# Patient Record
Sex: Male | Born: 2000
Health system: Southern US, Community
[De-identification: ages and names within clinical notes are randomized; demographics above are authoritative.]

## PROBLEM LIST (undated history)

## (undated) DIAGNOSIS — F419 Anxiety disorder, unspecified: Secondary | ICD-10-CM

## (undated) DIAGNOSIS — F410 Panic disorder [episodic paroxysmal anxiety] without agoraphobia: Secondary | ICD-10-CM

---
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
## 2019-01-19 ENCOUNTER — Other Ambulatory Visit: Payer: Self-pay

## 2019-01-19 ENCOUNTER — Ambulatory Visit (INDEPENDENT_AMBULATORY_CARE_PROVIDER_SITE_OTHER): Payer: 59 | Admitting: Family Medicine

## 2019-01-19 DIAGNOSIS — F419 Anxiety disorder, unspecified: Secondary | ICD-10-CM | POA: Diagnosis not present

## 2019-01-19 DIAGNOSIS — F329 Major depressive disorder, single episode, unspecified: Secondary | ICD-10-CM

## 2019-01-19 DIAGNOSIS — F410 Panic disorder [episodic paroxysmal anxiety] without agoraphobia: Secondary | ICD-10-CM | POA: Diagnosis not present

## 2019-01-19 DIAGNOSIS — F32A Depression, unspecified: Secondary | ICD-10-CM

## 2019-01-19 MED ORDER — CLONAZEPAM 0.5 MG PO TABS: ORAL_TABLET | ORAL | 0 refills | Status: DC

## 2019-01-19 NOTE — Progress Notes (Signed)
Subjective:    Patient ID: Darrell Gonzales, male    DOB: 08-03-2000, 18 y.o.   MRN: [id number]  HPImother Trula Ore states he was shaking really bad last night and could not control his breathing and states he felt like he was having a heart attack. Lasted about 30 -40 minutes.  He had chills and then would get hot. No fever when checked. Does feel like he is stressed. Mom started to go to ER but then he calmed down. He states he did eat a lot and his stomach was hurting. Gave a tums and it helped.   Gad 7 and phq9 done. When doing phq9 pt states he does sometimes have thoughts of hurting himself. GAD 7 : Generalized Anxiety Score 01/19/2019  Nervous, Anxious, on Edge 3  Control/stop worrying 3  Worry too much - different things 3  Trouble relaxing 1  Restless 0  Easily annoyed or irritable 1  Afraid - awful might happen 1  Total GAD 7 Score 12  Anxiety Difficulty Somewhat difficult    Depression screen PHQ 2/9 01/19/2019  Decreased Interest 3  Down, Depressed, Hopeless 3  PHQ - 2 Score 6  Altered sleeping 0  Tired, decreased energy 1  Change in appetite 0  Feeling bad or failure about yourself  0  Trouble concentrating 0  Moving slowly or fidgety/restless 1  Suicidal thoughts 1  PHQ-9 Score 9  Difficult doing work/chores Somewhat difficult   This patient states he has been stressed a lot he is not specific about what he stressed about but he states at times he just feels stressed out he denies currently thinking about hurting himself but states at times he has he also states he is gone to counselors in the past but it really does not seem to help patient in addition to this finds himself anxious nervous occasional panic attacks and also at times feels depressed  His mom was on the phone also while we were doing this conversation she confirmed the above  Virtual Visit via Telephone Note  I connected with Darrell Gonzales on 01/19/19 at  1:10 PM EST by telephone and verified that I  am speaking with the correct person using two identifiers.  Location: Patient: home Provider: office   I discussed the limitations, risks, security and privacy concerns of performing an evaluation and management service by telephone and the availability of in person appointments. I also discussed with the patient that there may be a patient responsible charge related to this service. The patient expressed understanding and agreed to proceed.     History of Present Illness:    Observations/Objective:   Assessment and Plan:   Follow Up Instructions:    I discussed the assessment and treatment plan with the patient. The patient was provided an opportunity to ask questions and all were answered. The patient agreed with the plan and demonstrated an understanding of the instructions.   The patient was advised to call back or seek an in-person evaluation if the symptoms worsen or if the condition fails to improve as anticipated.  I provided 17 minutes of non-face-to-face time during this encounter.         Review of Systems  Constitutional: Negative for activity change.  HENT: Negative for congestion and rhinorrhea.   Respiratory: Negative for cough and shortness of breath.   Cardiovascular: Negative for chest pain.  Gastrointestinal: Negative for abdominal pain, diarrhea, nausea and vomiting.  Genitourinary: Negative for dysuria and hematuria.  Neurological: Negative for weakness and headaches.  Psychiatric/Behavioral: Positive for dysphoric mood. Negative for behavioral problems and confusion. The patient is nervous/anxious.        Objective:   Physical Exam   Today's visit was via telephone Physical exam was not possible for this visit      Assessment & Plan:  I am concerned that this patient has a combination of depression with anxiety along with panic attacks Offered counseling and referral patient states at this moment he does not feel like he needs to do  counseling Also discussed SSRI medication with him and he defers on this currently Patient does opt for nerve medication to use on rare occasions for panic attacks small number were sent in I discussed it with both he and his mother and recommended that she help monitor his medication Also recommend a follow-up visit in 2 to 3 weeks he would prefer to do that by phone Also told young man should he start feeling like he is going to hurt himself or have any worsening depression to reach out for help either with [place] the ER or behavioral health right away  His phone #[phone]

## 2019-01-27 ENCOUNTER — Other Ambulatory Visit: Payer: Self-pay

## 2019-01-27 ENCOUNTER — Emergency Department (HOSPITAL_COMMUNITY)
Admission: EM | Admit: 2019-01-27 | Discharge: 2019-01-28 | Disposition: A | Discharge status: Home / Self Care | Payer: 59 | Attending: Emergency Medicine | Admitting: Emergency Medicine

## 2019-01-27 ENCOUNTER — Encounter (HOSPITAL_COMMUNITY): Payer: Self-pay

## 2019-01-27 DIAGNOSIS — Z79899 Other long term (current) drug therapy: Secondary | ICD-10-CM | POA: Diagnosis not present

## 2019-01-27 DIAGNOSIS — F410 Panic disorder [episodic paroxysmal anxiety] without agoraphobia: Secondary | ICD-10-CM | POA: Diagnosis present

## 2019-01-27 HISTORY — DX: Anxiety disorder, unspecified: F41.9

## 2019-01-27 HISTORY — DX: Panic disorder (episodic paroxysmal anxiety): F41.0

## 2019-01-27 LAB — COMPREHENSIVE METABOLIC PANEL
ALT: 23 U/L (ref 0–44)
AST: 23 U/L (ref 15–41)
Albumin: 5 g/dL (ref 3.5–5.0)
Alkaline Phosphatase: 63 U/L (ref 38–126)
Anion gap: 11 (ref 5–15)
BUN: 14 mg/dL (ref 6–20)
CO2: 22 mmol/L (ref 22–32)
Calcium: 9.8 mg/dL (ref 8.9–10.3)
Chloride: 103 mmol/L (ref 98–111)
Creatinine, Ser: 1.3 mg/dL — ABNORMAL HIGH (ref 0.61–1.24)
GFR calc Af Amer: >60 mL/min (ref >60)
GFR calc non Af Amer: >60 mL/min (ref >60)
Glucose, Bld: 107 mg/dL — ABNORMAL HIGH (ref 70–99)
Potassium: 3.3 mmol/L — ABNORMAL LOW (ref 3.5–5.1)
Sodium: 136 mmol/L (ref 135–145)
Total Bilirubin: 1 mg/dL (ref 0.3–1.2)
Total Protein: 7.7 g/dL (ref 6.5–8.1)

## 2019-01-27 LAB — ACETAMINOPHEN LEVEL: Acetaminophen (Tylenol), Serum: <10 ug/mL — ABNORMAL LOW (ref 10–30)

## 2019-01-27 LAB — CBC
HCT: 45.9 % (ref 39.0–52.0)
Hemoglobin: 15.9 g/dL (ref 13.0–17.0)
MCH: 30.7 pg (ref 26.0–34.0)
MCHC: 34.6 g/dL (ref 30.0–36.0)
MCV: 88.6 fL (ref 80.0–100.0)
Platelets: 247 10*3/uL (ref 150–400)
RBC: 5.18 MIL/uL (ref 4.22–5.81)
RDW: 12 % (ref 11.5–15.5)
WBC: 5.7 10*3/uL (ref 4.0–10.5)
nRBC: 0 % (ref 0.0–0.2)

## 2019-01-27 LAB — RAPID URINE DRUG SCREEN, HOSP PERFORMED
Amphetamines: NOT DETECTED
Barbiturates: NOT DETECTED
Benzodiazepines: NOT DETECTED
Cocaine: NOT DETECTED
Opiates: NOT DETECTED
Tetrahydrocannabinol: NOT DETECTED

## 2019-01-27 LAB — SALICYLATE LEVEL: Salicylate Lvl: <7 mg/dL — ABNORMAL LOW (ref 7.0–30.0)

## 2019-01-27 LAB — ETHANOL: Alcohol, Ethyl (B): <10 mg/dL (ref <10)

## 2019-01-27 NOTE — Discharge Instructions (Addendum)
Continue medications as previously prescribed.  Return to the emergency department if you develop any new and/or concerning symptoms. 

## 2019-01-27 NOTE — ED Provider Notes (Signed)
[facility] EMERGENCY DEPARTMENT Provider Note   CSN: [id number] Arrival date & time: 01/27/19  2048     History Chief Complaint  Patient presents with  . Panic Attack  . Anxiety    Darrell Gonzales is a 19 y.o. male.  Patient is a 19 year old male with history of anxiety/panic attacks.  Darrell Gonzales presents today for evaluation of anxiety and believing that Darrell Gonzales had a panic attack.  Patient states Darrell Gonzales has had a "a lot going on" lately.  This evening Darrell Gonzales became very anxious and began to hyperventilate.  This is happened 3 times this week which is more than normal.  Patient has clonazepam at home that Darrell Gonzales takes as needed.  Darrell Gonzales took 1 this evening prior to coming here and is now feeling better.  Darrell Gonzales denies any drug or alcohol use.  Darrell Gonzales denies any suicidal or homicidal ideation.  The history is provided by the patient.  Anxiety This is a recurrent problem. The current episode started 1 to 2 hours ago. The problem occurs constantly. The problem has been resolved. Pertinent negatives include no chest pain and no shortness of breath. Nothing aggravates the symptoms. Nothing relieves the symptoms.       Past Medical History:  Diagnosis Date  . Anxiety   . Panic attack     Patient Active Problem List   Diagnosis Date Noted  . Migraine headache without aura 09/27/2013    No past surgical history on file.     No family history on file.  Social History   Tobacco Use  . Smoking status: Never Smoker  . Smokeless tobacco: Never Used  Substance Use Topics  . Alcohol use: Not on file  . Drug use: Not on file    Home Medications Prior to Admission medications   Medication Sig Start Date End Date Taking? Authorizing Provider  clonazePAM (KLONOPIN) 0.5 MG tablet Take one bid prn panic attacks 01/19/19   Babs Sciara, MD  diclofenac (VOLTAREN) 75 MG EC tablet Take 1 tablet (75 mg total) by mouth 2 (two) times daily. Patient not taking: Reported on 01/19/2019 01/24/17   Babs Sciara, MD     Allergies    Patient has no known allergies.  Review of Systems   Review of Systems  Respiratory: Negative for shortness of breath.   Cardiovascular: Negative for chest pain.  All other systems reviewed and are negative.   Physical Exam Updated Vital Signs BP 137/74 (BP Location: Right Arm)   Pulse 94   Temp 99 F (37.2 C) (Oral)   Resp 20   Ht 5\' 11"  (1.803 m)   SpO2 100%   Physical Exam Vitals and nursing note reviewed.  Constitutional:      General: Darrell Gonzales is not in acute distress.    Appearance: Darrell Gonzales is well-developed. Darrell Gonzales is not diaphoretic.  HENT:     Head: Normocephalic and atraumatic.  Cardiovascular:     Rate and Rhythm: Normal rate and regular rhythm.     Heart sounds: No murmur. No friction rub.  Pulmonary:     Effort: Pulmonary effort is normal. No respiratory distress.     Breath sounds: Normal breath sounds. No wheezing or rales.  Abdominal:     General: Bowel sounds are normal. There is no distension.     Palpations: Abdomen is soft.     Tenderness: There is no abdominal tenderness.  Musculoskeletal:        General: Normal range of motion.  Cervical back: Normal range of motion and neck supple.  Skin:    General: Skin is warm and dry.  Neurological:     Mental Status: Darrell Gonzales is alert and oriented to person, place, and time.     Coordination: Coordination normal.     ED Results / Procedures / Treatments   Labs (all labs ordered are listed, but only abnormal results are displayed) Labs Reviewed  COMPREHENSIVE METABOLIC PANEL - Abnormal; Notable for the following components:      Result Value   Potassium 3.3 (*)    Glucose, Bld 107 (*)    Creatinine, Ser 1.30 (*)    All other components within normal limits  SALICYLATE LEVEL - Abnormal; Notable for the following components:   Salicylate Lvl <8.7 (*)    All other components within normal limits  ACETAMINOPHEN LEVEL - Abnormal; Notable for the following components:   Acetaminophen (Tylenol), Serum  <10 (*)    All other components within normal limits  ETHANOL  CBC  RAPID URINE DRUG SCREEN, HOSP PERFORMED    EKG None  Radiology No results found.  Procedures Procedures (including critical care time)  Medications Ordered in ED Medications - No data to display  ED Course  I have reviewed the triage vital signs and the nursing notes.  Pertinent labs & imaging results that were available during my care of the patient were reviewed by me and considered in my medical decision making (see chart for details).    MDM Rules/Calculators/A&P  Patient presenting here with complaints of panic attack.  It seems to have resolved shortly after arriving.  Patient did take Klonopin prior to coming here.  His vitals are stable and laboratory studies are all unremarkable.  At this point, I see no indication for further evaluation or admission.  Darrell Gonzales denies any suicidal or homicidal ideation and is not having any auditory or visual hallucinations.  Final Clinical Impression(s) / ED Diagnoses Final diagnoses:  None    Rx / DC Orders ED Discharge Orders    None       Veryl Speak, MD 01/27/19 2342

## 2019-01-27 NOTE — ED Triage Notes (Signed)
Mother brings pt in for panic attacks. Said that this last time he was shaking his arms 3 in one week. This has never happened before this week.  Took antianxiety med 45 min-1 hour ago. Pt twitchy in triage.

## 2019-01-27 NOTE — ED Notes (Signed)
Lab at bedside

## 2019-02-06 IMAGING — DX DG KNEE COMPLETE 4+V*R*
4 series · 4 of 4 positions shown · non-contrast
Comparison: None.

CLINICAL DATA: Right knee effusion with generalized pain for
weeks. Pain began while playing basketball. No specific injury.

EXAM:
RIGHT KNEE - COMPLETE 4+ VIEW

[knee ap (1 of 3)]
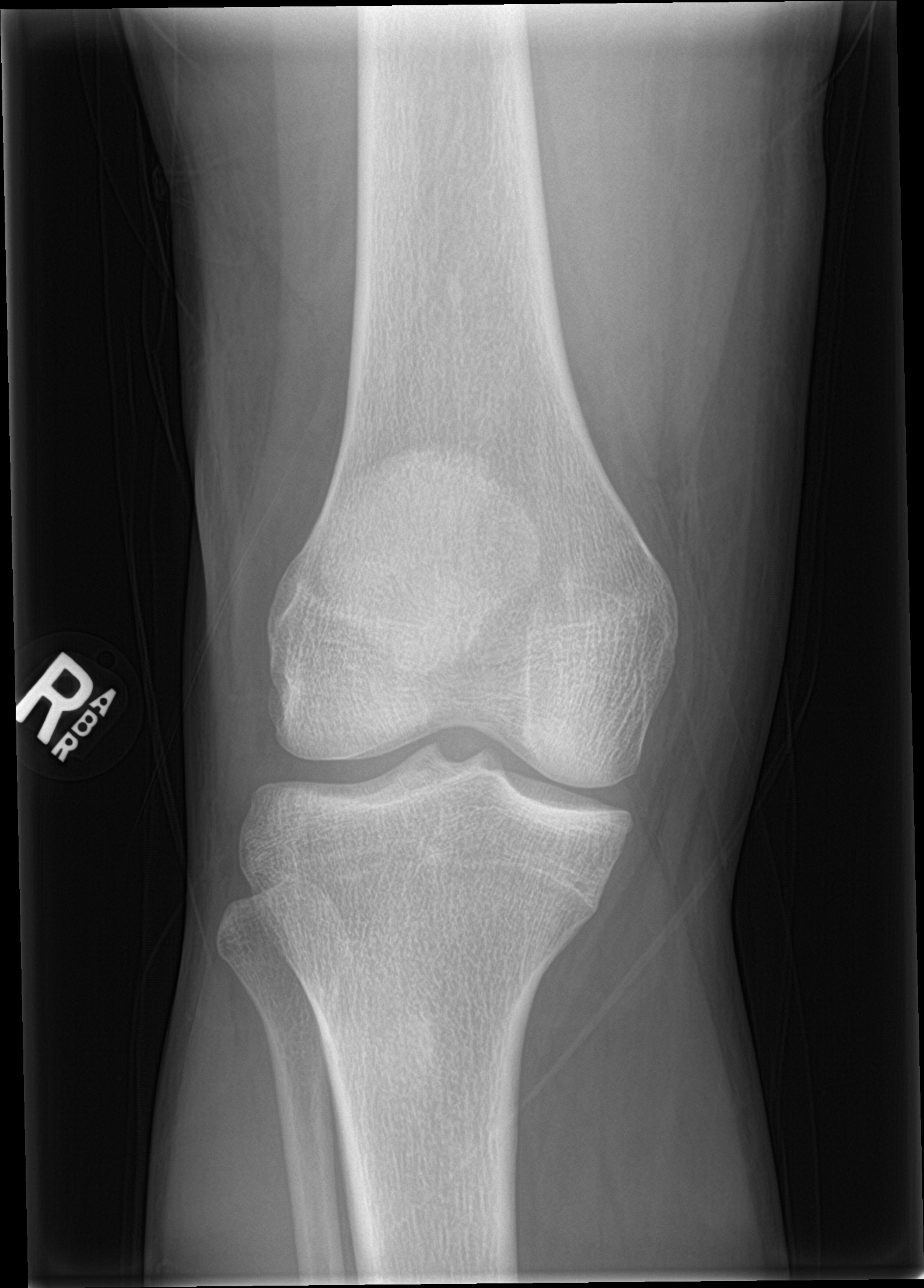

[knee ap (2 of 3)]
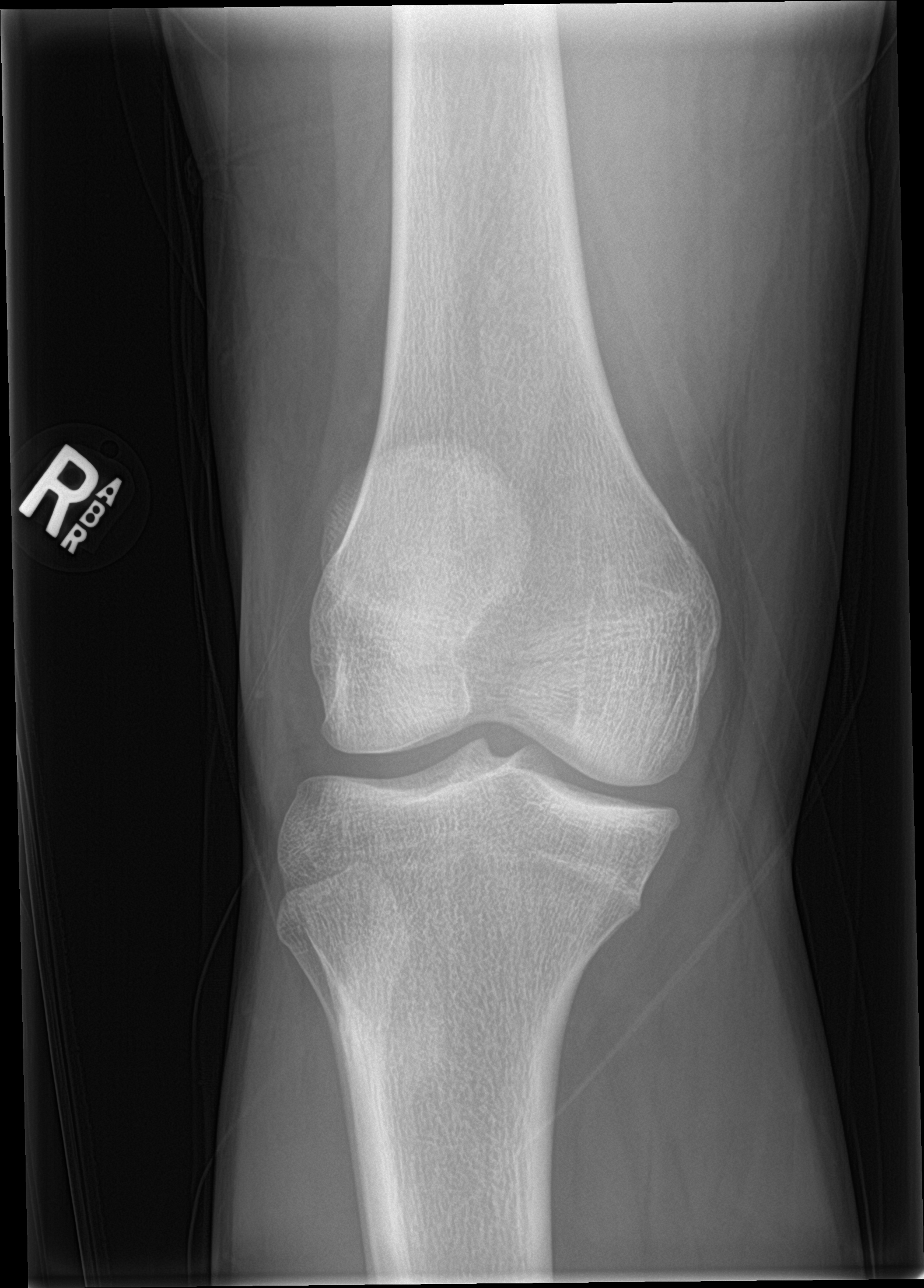

[knee ap (3 of 3)]
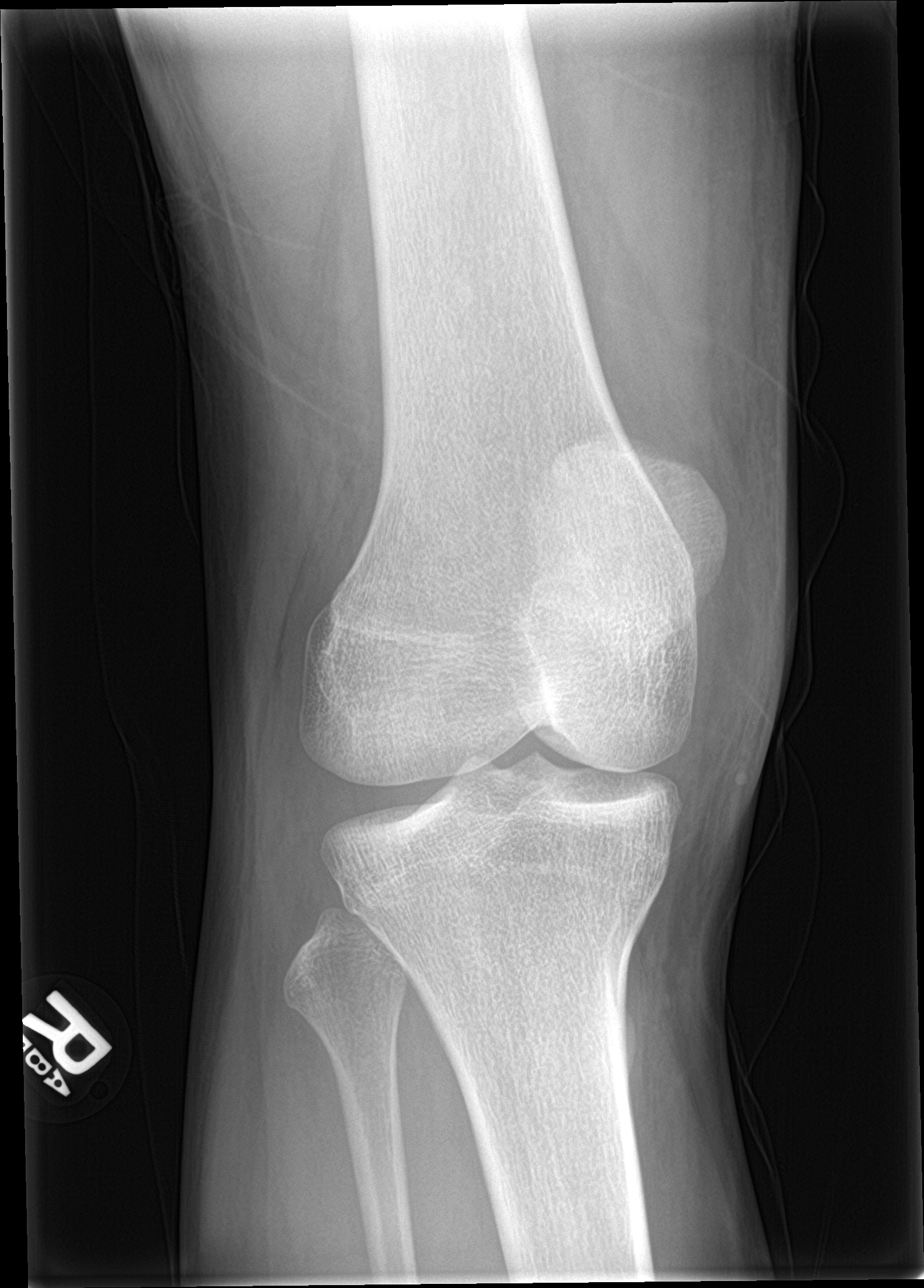

[knee lat]
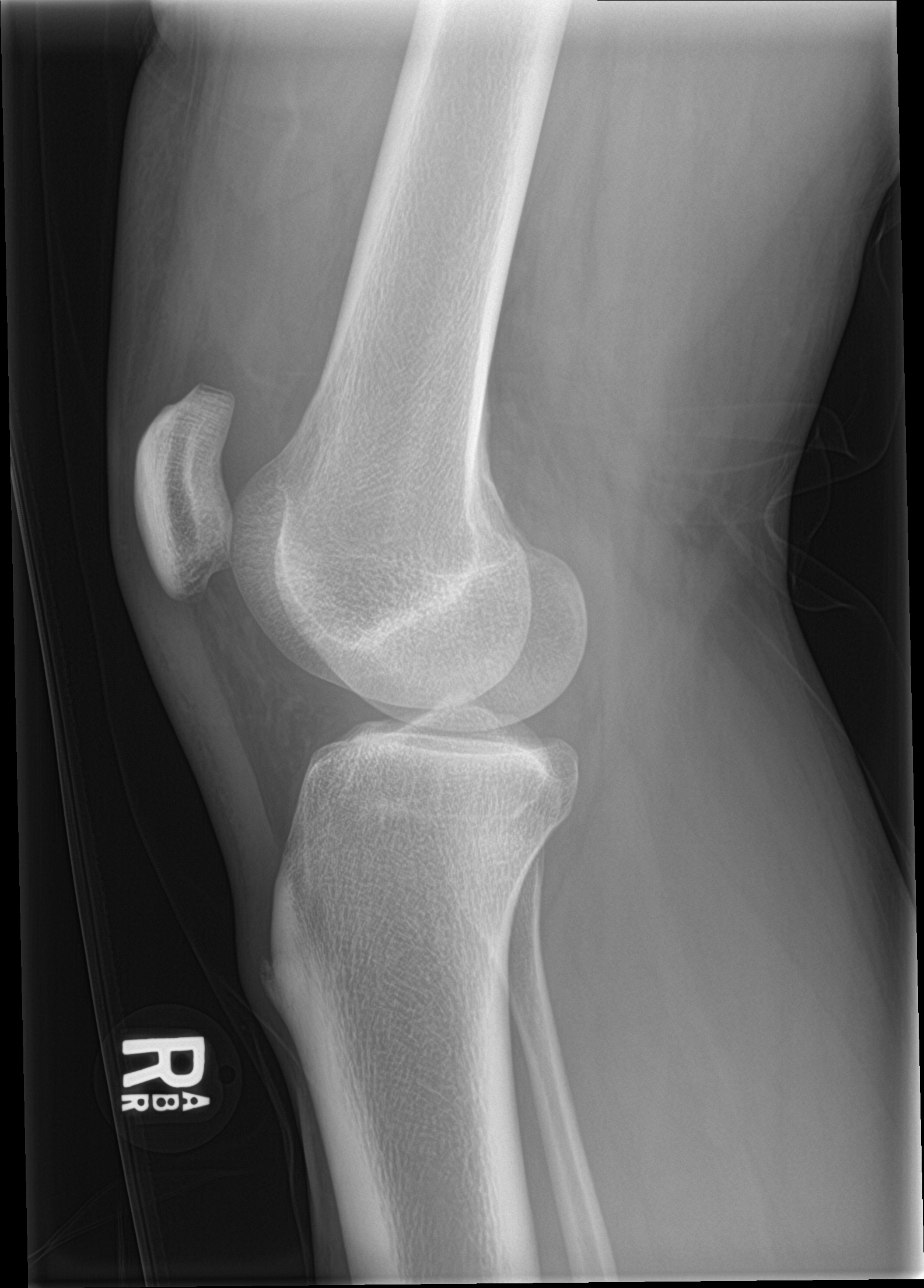

[4 of 4 positions shown; findings below may reference images not displayed]

FINDINGS: Moderate size right knee effusion. No evidence of acute fracture or
dislocation. No focal bone lesion or bone destruction. No radiopaque
soft tissue foreign bodies.
IMPRESSION: Moderate right knee effusion.  No acute bony abnormalities.

## 2019-12-15 ENCOUNTER — Other Ambulatory Visit: Payer: Self-pay

## 2019-12-15 ENCOUNTER — Ambulatory Visit (INDEPENDENT_AMBULATORY_CARE_PROVIDER_SITE_OTHER): Payer: 59 | Admitting: Family Medicine

## 2019-12-15 ENCOUNTER — Encounter: Payer: Self-pay | Admitting: Family Medicine

## 2019-12-15 VITALS — BP 126/78 | HR 60 | Temp 97.6°F | Ht 69.0 in | Wt 204.6 lb

## 2019-12-15 DIAGNOSIS — R0789 Other chest pain: Secondary | ICD-10-CM

## 2019-12-15 DIAGNOSIS — R5383 Other fatigue: Secondary | ICD-10-CM

## 2019-12-15 DIAGNOSIS — Z13220 Encounter for screening for lipoid disorders: Secondary | ICD-10-CM | POA: Diagnosis not present

## 2019-12-15 DIAGNOSIS — F410 Panic disorder [episodic paroxysmal anxiety] without agoraphobia: Secondary | ICD-10-CM

## 2019-12-15 DIAGNOSIS — Z1329 Encounter for screening for other suspected endocrine disorder: Secondary | ICD-10-CM

## 2019-12-15 DIAGNOSIS — F32A Depression, unspecified: Secondary | ICD-10-CM

## 2019-12-15 DIAGNOSIS — Z0000 Encounter for general adult medical examination without abnormal findings: Secondary | ICD-10-CM | POA: Diagnosis not present

## 2019-12-15 DIAGNOSIS — F419 Anxiety disorder, unspecified: Secondary | ICD-10-CM

## 2019-12-15 NOTE — Progress Notes (Signed)
Subjective:    Patient ID: Darrell Gonzales, male    DOB: 08/03/2000, 19 y.o.   MRN: [id number]  HPI  The patient comes in today for a wellness visit.  Patient suffering with the anxiety and depression Has gone to multiple different counselors over the past several years and he states none of them helped so he stopped going Unfortunately suffers with depression anxiety occasional panic attacks he is fearful that there is some problems underlying with his heart he is also fearful that there could be something else wrong that has not been detected in addition to this he does do a little bit of exercise but does not do much physical activity and only works part-time  A review of their health history was completed.  A review of medications was also completed.   Eating habits: not great- eats late at night and no veggies  Falls/  MVA accidents in past few months: none  Regular exercise: yes  Specialist pt sees on regular basis: none  Preventative health issues were discussed.   Additional concerns: Patient and mom have concerns of anxiety and panic attacks. Patient reports feeling fatigued and chest tightness at times and also mentioned his "heart doesn't beat right".  Review of Systems  Constitutional: Negative for diaphoresis and fatigue.  HENT: Negative for congestion and rhinorrhea.   Respiratory: Negative for cough and shortness of breath.   Cardiovascular: Negative for chest pain and leg swelling.  Gastrointestinal: Negative for abdominal pain and diarrhea.  Skin: Negative for color change and rash.  Neurological: Negative for dizziness and headaches.  Psychiatric/Behavioral: Negative for agitation, behavioral problems and confusion. The patient is nervous/anxious.        Objective:   Physical Exam Vitals reviewed.  Constitutional:      General: He is not in acute distress. HENT:     Head: Normocephalic and atraumatic.  Eyes:     General:        Right eye: No  discharge.        Left eye: No discharge.  Neck:     Trachea: No tracheal deviation.  Cardiovascular:     Rate and Rhythm: Normal rate and regular rhythm.     Heart sounds: Normal heart sounds. No murmur heard.   Pulmonary:     Effort: Pulmonary effort is normal. No respiratory distress.     Breath sounds: Normal breath sounds.  Lymphadenopathy:     Cervical: No cervical adenopathy.  Skin:    General: Skin is warm and dry.  Neurological:     Mental Status: He is alert.     Coordination: Coordination normal.  Psychiatric:        Behavior: Behavior normal.           Assessment & Plan:  Well adult exam - Plan: Basic metabolic panel  Other fatigue - Plan: CBC with Differential/Platelet, Basic metabolic panel, Hepatic function panel, TSH  Other chest pain - Plan: PR ELECTROCARDIOGRAM, COMPLETE  Screening, lipid - Plan: Lipid panel  Screening for thyroid disorder - Plan: TSH  Panic attack - Plan: Ambulatory referral to Psychiatry  Anxiety and depression - Plan: Ambulatory referral to Psychiatry med 1. Well adult exam Adult wellness-complete.wellness physical was conducted today. Importance of diet and exercise were discussed in detail.  In addition to this a discussion regarding safety was also covered. We also reviewed over immunizations and gave recommendations regarding current immunization needed for age.  In addition to this additional areas were also touched  on including: Preventative health exams needed:  Colonoscopy not indicated  Patient was advised yearly wellness exam  - Basic metabolic panel  2. Other fatigue We will check lab work.  Await the results. - CBC with Differential/Platelet - Basic metabolic panel - Hepatic function panel - TSH  3. Other chest pain I really doubt that this is heart disease.  I believe it is related to his severe depression and anxiety EKG looks good lungs sound good heart regular.  I would not recommend further testing  currently - PR ELECTROCARDIOGRAM, COMPLETE  4. Screening, lipid Mild hyperlipidemia.  Healthy diet regular activity recommended - Lipid panel  5. Screening for thyroid disorder Check thyroid because of severe fatigue - TSH  6. Panic attack Referral to psychiatry for anxiety and depression - Ambulatory referral to Psychiatry  7. Anxiety and depression Significant depression I did counsel the patient if he starts feeling suicidal to immediately go to any ER or behavioral health or urgent care center for behavioral health in [place] the phone number was given to him his mother was present when we discussed this the patient states that he is not suicidal currently. - Ambulatory referral to Psychiatry  Recheck in 1 month

## 2019-12-24 ENCOUNTER — Encounter: Payer: Self-pay | Admitting: Family Medicine

## 2019-12-24 ENCOUNTER — Telehealth: Payer: Self-pay | Admitting: Family Medicine

## 2020-01-13 ENCOUNTER — Ambulatory Visit: Payer: 59 | Admitting: Family Medicine

## 2020-01-21 ENCOUNTER — Other Ambulatory Visit: Payer: 59

## 2020-11-06 ENCOUNTER — Other Ambulatory Visit: Payer: Self-pay

## 2020-11-06 ENCOUNTER — Ambulatory Visit
Admission: EM | Admit: 2020-11-06 | Discharge: 2020-11-06 | Disposition: A | Discharge status: Home / Self Care | Payer: 59 | Attending: Emergency Medicine | Admitting: Emergency Medicine

## 2020-11-06 DIAGNOSIS — B9789 Other viral agents as the cause of diseases classified elsewhere: Secondary | ICD-10-CM | POA: Diagnosis not present

## 2020-11-06 DIAGNOSIS — J988 Other specified respiratory disorders: Secondary | ICD-10-CM

## 2020-11-06 DIAGNOSIS — Z20822 Contact with and (suspected) exposure to covid-19: Secondary | ICD-10-CM

## 2020-11-06 MED ORDER — FLUTICASONE PROPIONATE 50 MCG/ACT NA SUSP: 2.0000 | Freq: Every day | NASAL | 0 refills | Status: AC

## 2020-11-06 MED ORDER — IBUPROFEN 600 MG PO TABS: 600.0000 mg | ORAL_TABLET | Freq: Four times a day (QID) | ORAL | 0 refills | Status: AC | PRN

## 2020-11-06 NOTE — ED Triage Notes (Signed)
Patient reports having a headache, fatigue, cough and sore throat that started yesterday.

## 2020-11-06 NOTE — ED Notes (Signed)
Attempt to call patient.

## 2020-11-06 NOTE — Discharge Instructions (Addendum)
Start Mucinex-D to keep the mucous thin and to decongest you.   You may take 600 mg of motrin with 1000 mg of tylenol up to 3-4 times a day as needed for pain. This is an effective combination for pain.   Use a NeilMed sinus rinse with distilled water as often as you want to to reduce nasal congestion. Follow the directions on the box.  Flonase will also help with the nasal congestion.  COVID will be back in a day or 2.  You will get the results through MyChart.  We will contact you if it is positive.   Go to www.goodrx.com to look up your medications. This will give you a list of where you can find your prescriptions at the most affordable prices. Or you can ask the pharmacist what the cash price is. This is frequently cheaper than going through insurance.

## 2020-11-06 NOTE — ED Provider Notes (Signed)
HPI  SUBJECTIVE:  Darrell Gonzales is a 20 y.o. male who presents with headache, fatigue, nasal congestion, rhinorrhea, mild sore throat, mild occasional cough starting this morning.  No fevers, body aches, postnasal drip, loss of sense of smell or taste, wheezing or shortness of breath, nausea, vomiting, diarrhea, abdominal pain.  He is reporting sneezing, but no itchy, watery eyes.  No antibiotics in the past month.  No antipyretic in the past 6 hours.  No known COVID or flu exposure.  He did not get this years flu vaccine.  He got the second dose of the COVID-vaccine.  He has tried Mucinex without improvement in his symptoms.  No aggravating factors.  He has a past medical history of allergies.  CZY:SAYTKZ, Jonna Coup, MD  Past Medical History:  Diagnosis Date   Anxiety    Panic attack     History reviewed. No pertinent surgical history.  History reviewed. No pertinent family history.  Social History   Tobacco Use   Smoking status: Never   Smokeless tobacco: Never  Vaping Use   Vaping Use: Never used  Substance Use Topics   Alcohol use: Not Currently   Drug use: Yes    Types: Marijuana    No current facility-administered medications for this encounter.  Current Outpatient Medications:    fluticasone (FLONASE) 50 MCG/ACT nasal spray, Place 2 sprays into both nostrils daily., Disp: 16 g, Rfl: 0   ibuprofen (ADVIL) 600 MG tablet, Take 1 tablet (600 mg total) by mouth every 6 (six) hours as needed., Disp: 30 tablet, Rfl: 0  No Known Allergies   ROS  As noted in HPI.   Physical Exam  BP 119/73 (BP Location: Right Arm)   Pulse 70   Temp 98.1 F (36.7 C) (Oral)   Resp 18   SpO2 98%   Constitutional: Well developed, well nourished, no acute distress Eyes:  EOMI, conjunctiva normal bilaterally HENT: Normocephalic, atraumatic,mucus membranes moist.  Frontal sinus tenderness.  No maxillary sinus tenderness.  Erythematous, swollen turbinates with extensive clear nasal  congestion.  Slightly erythematous oropharynx, tonsils normal size without exudates.  Uvula midline. Neck: No cervical lymphadenopathy Respiratory: Normal inspiratory effort, lungs clear bilaterally Cardiovascular: Normal rate, regular rhythm no murmurs rubs or gallop GI: nondistended skin: No rash, skin intact Musculoskeletal: no deformities Neurologic: Alert & oriented x 3, no focal neuro deficits Psychiatric: Speech and behavior appropriate   ED Course   Medications - No data to display  Orders Placed This Encounter  Procedures   Novel Coronavirus, NAA ([facility])    Standing Status:   Standing    Number of Occurrences:   1    No results found for this or any previous visit (from the past 24 hour(s)). No results found.  ED Clinical Impression  1. Viral respiratory infection   2. Encounter for laboratory testing for COVID-19 virus      ED Assessment/Plan  Patient with a URI.  Sending off COVID.  Unfortunately, he will not be a candidate for antivirals because of the lack of comorbidities.  We will send home with supportive treatment including Tylenol/ibuprofen, Flonase, saline nasal irrigation, Mucinex D.  Work note.  Discussed labs, MDM, treatment plan, and plan for follow-up with patient. patient agrees with plan.   Meds ordered this encounter  Medications   fluticasone (FLONASE) 50 MCG/ACT nasal spray    Sig: Place 2 sprays into both nostrils daily.    Dispense:  16 g    Refill:  0  ibuprofen (ADVIL) 600 MG tablet    Sig: Take 1 tablet (600 mg total) by mouth every 6 (six) hours as needed.    Dispense:  30 tablet    Refill:  0      *This clinic note was created using Scientist, clinical (histocompatibility and immunogenetics). Therefore, there may be occasional mistakes despite careful proofreading.  ?    Domenick Gong, MD 11/07/20 [phone]

## 2020-11-07 LAB — SARS-COV-2, NAA 2 DAY TAT

## 2020-11-07 LAB — NOVEL CORONAVIRUS, NAA: SARS-CoV-2, NAA: NOT DETECTED

## 2020-12-05 ENCOUNTER — Ambulatory Visit
Admission: EM | Admit: 2020-12-05 | Discharge: 2020-12-05 | Disposition: A | Discharge status: Home / Self Care | Payer: 59 | Attending: Family Medicine | Admitting: Family Medicine

## 2020-12-05 ENCOUNTER — Other Ambulatory Visit: Payer: Self-pay

## 2020-12-05 DIAGNOSIS — K644 Residual hemorrhoidal skin tags: Secondary | ICD-10-CM | POA: Diagnosis not present

## 2020-12-05 MED ORDER — HYDROCORTISONE (PERIANAL) 2.5 % EX CREA: 1.0000 "application " | TOPICAL_CREAM | Freq: Two times a day (BID) | CUTANEOUS | 1 refills | Status: DC | PRN

## 2020-12-05 NOTE — ED Triage Notes (Signed)
Pt presents with what he thinks may be hemorrhoids, does a lot of heavy lifting at work

## 2020-12-05 NOTE — ED Provider Notes (Signed)
[facility]    CSN: [id number] Arrival date & time: 12/05/20  1558      History   Chief Complaint Chief Complaint  Patient presents with   Hemorrhoids    HPI Darrell Gonzales is a 20 y.o. male.   Patient presenting today with several day history of anal inflammation, irritation and itching that he believes to be from heavy lifting at work.  He states he thinks he has hemorrhoids.  Has been taking over-the-counter hemorrhoid cream and states this has been helping quite a bit.  He denies bloody stools, abdominal pain, fever, nausea, vomiting, constipation.   Past Medical History:  Diagnosis Date   Anxiety    Panic attack     Patient Active Problem List   Diagnosis Date Noted   Migraine headache without aura 09/27/2013    History reviewed. No pertinent surgical history.     Home Medications    Prior to Admission medications   Medication Sig Start Date End Date Taking? Authorizing Provider  hydrocortisone (ANUSOL-HC) 2.5 % rectal cream Place 1 application rectally 2 (two) times daily as needed for hemorrhoids or anal itching. 12/05/20  Yes Particia Nearing, PA-C  fluticasone [facility]) 50 MCG/ACT nasal spray Place 2 sprays into both nostrils daily. 11/06/20   Domenick Gong, MD  ibuprofen (ADVIL) 600 MG tablet Take 1 tablet (600 mg total) by mouth every 6 (six) hours as needed. 11/06/20   Domenick Gong, MD    Family History History reviewed. No pertinent family history.  Social History Social History   Tobacco Use   Smoking status: Never   Smokeless tobacco: Never  Vaping Use   Vaping Use: Never used  Substance Use Topics   Alcohol use: Not Currently   Drug use: Yes    Types: Marijuana     Allergies   Patient has no known allergies.   Review of Systems Review of Systems Per HPI  Physical Exam Triage Vital Signs ED Triage Vitals  Enc Vitals Group     BP 12/05/20 1852 127/70     Pulse Rate 12/05/20 1852 (!) 48     Resp  12/05/20 1852 20     Temp 12/05/20 1852 98.3 F (36.8 C)     Temp src --      SpO2 12/05/20 1852 99 %     Weight --      Height --      Head Circumference --      Peak Flow --      Pain Score 12/05/20 1851 3     Pain Loc --      Pain Edu? --      Excl. in [place]? --    No data found.  Updated Vital Signs BP 127/70   Pulse (!) 48   Temp 98.3 F (36.8 C)   Resp 20   SpO2 99%   Visual Acuity Right Eye Distance:   Left Eye Distance:   Bilateral Distance:    Right Eye Near:   Left Eye Near:    Bilateral Near:     Physical Exam Vitals and nursing note reviewed.  Constitutional:      Appearance: Normal appearance.  HENT:     Head: Atraumatic.  Eyes:     Extraocular Movements: Extraocular movements intact.     Conjunctiva/sclera: Conjunctivae normal.  Cardiovascular:     Rate and Rhythm: Normal rate and regular rhythm.  Pulmonary:     Effort: Pulmonary effort is normal.  Breath sounds: Normal breath sounds.  Abdominal:     General: Bowel sounds are normal. There is no distension.     Palpations: Abdomen is soft.     Tenderness: There is no abdominal tenderness. There is no guarding.  Genitourinary:    Comments: Declines GU exam Musculoskeletal:        General: Normal range of motion.     Cervical back: Normal range of motion and neck supple.  Skin:    General: Skin is warm and dry.  Neurological:     General: No focal deficit present.     Mental Status: He is oriented to person, place, and time.  Psychiatric:        Mood and Affect: Mood normal.        Thought Content: Thought content normal.        Judgment: Judgment normal.    UC Treatments / Results  Labs (all labs ordered are listed, but only abnormal results are displayed) Labs Reviewed - No data to display  EKG   Radiology No results found.  Procedures Procedures (including critical care time)  Medications Ordered in UC Medications - No data to display  Initial Impression / Assessment  and Plan / UC Course  I have reviewed the triage vital signs and the nursing notes.  Pertinent labs & imaging results that were available during my care of the patient were reviewed by me and considered in my medical decision making (see chart for details).     Symptoms consistent with external hemorrhoids, will treat with Anusol cream, avoid heavy lifting, sitz bath, Tucks wipes, fiber supplements, fluid intake.  Return for worsening symptoms.  Final Clinical Impressions(s) / UC Diagnoses   Final diagnoses:  External hemorrhoids   Discharge Instructions   None    ED Prescriptions     Medication Sig Dispense Auth. Provider   hydrocortisone (ANUSOL-HC) 2.5 % rectal cream Place 1 application rectally 2 (two) times daily as needed for hemorrhoids or anal itching. 80 g Particia Nearing, [place]      PDMP not reviewed this encounter.   Particia Nearing, [place] 12/05/20 1932

## 2021-01-17 ENCOUNTER — Other Ambulatory Visit: Payer: Self-pay

## 2021-01-17 ENCOUNTER — Ambulatory Visit (INDEPENDENT_AMBULATORY_CARE_PROVIDER_SITE_OTHER): Payer: 59 | Admitting: Family Medicine

## 2021-01-17 VITALS — BP 116/69 | HR 48 | Temp 98.2°F | Ht 69.5 in | Wt 188.0 lb

## 2021-01-17 DIAGNOSIS — Z0001 Encounter for general adult medical examination with abnormal findings: Secondary | ICD-10-CM

## 2021-01-17 DIAGNOSIS — R5383 Other fatigue: Secondary | ICD-10-CM | POA: Diagnosis not present

## 2021-01-17 DIAGNOSIS — Z0000 Encounter for general adult medical examination without abnormal findings: Secondary | ICD-10-CM
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
## 2021-01-23 ENCOUNTER — Other Ambulatory Visit: Payer: Self-pay

## 2021-01-23 ENCOUNTER — Ambulatory Visit
Admission: EM | Admit: 2021-01-23 | Discharge: 2021-01-23 | Disposition: A | Discharge status: Home / Self Care | Payer: 59 | Attending: Urgent Care | Admitting: Urgent Care

## 2021-01-23 DIAGNOSIS — Z1152 Encounter for screening for COVID-19: Secondary | ICD-10-CM | POA: Diagnosis not present

## 2021-01-23 NOTE — ED Triage Notes (Signed)
Pt needs covid test for school. No sx.

## 2021-01-24 ENCOUNTER — Encounter: Payer: Self-pay | Admitting: Family Medicine

## 2021-01-24 LAB — SICKLE CELL SCREEN: Sickle Cell Screen: NEGATIVE

## 2021-01-25 LAB — NOVEL CORONAVIRUS, NAA: SARS-CoV-2, NAA: NOT DETECTED

## 2021-01-25 LAB — SARS-COV-2, NAA 2 DAY TAT

## 2021-12-11 ENCOUNTER — Ambulatory Visit (INDEPENDENT_AMBULATORY_CARE_PROVIDER_SITE_OTHER): Payer: 59 | Admitting: Family Medicine

## 2021-12-11 VITALS — BP 114/70 | HR 77 | Temp 98.9°F | Ht 69.5 in | Wt 192.6 lb

## 2021-12-11 DIAGNOSIS — J019 Acute sinusitis, unspecified: Secondary | ICD-10-CM | POA: Diagnosis not present

## 2021-12-11 MED ORDER — PROMETHAZINE-DM 6.25-15 MG/5ML PO SYRP: 5.0000 mL | ORAL_SOLUTION | Freq: Four times a day (QID) | ORAL | 0 refills | Status: DC | PRN

## 2021-12-11 MED ORDER — AMOXICILLIN-POT CLAVULANATE 875-125 MG PO TABS: 1.0000 | ORAL_TABLET | Freq: Two times a day (BID) | ORAL | 0 refills | Status: DC

## 2021-12-11 NOTE — Progress Notes (Signed)
Subjective:  Patient ID: Darrell Gonzales, male    DOB: 03-21-2000  Age: 21 y.o. MRN: [id number]  CC: Chief Complaint  Patient presents with   Cough    1 x week   Headache   Nasal Congestion    HPI:  21 year old male for evaluation of the above.  Patient reports that he has been sick for the past week.  Reports sinus pressure and congestion, associated headache, and cough.  Reports that he has some initial sore throat but this is improved.  No documented fever.  He reports feeling hot and cold.  No reported sick contacts.  No relieving factors.  Patient Active Problem List   Diagnosis Date Noted   Acute rhinosinusitis 12/11/2021   Migraine headache without aura 09/27/2013    Social Hx   Social History   Socioeconomic History   Marital status: Single    Spouse name: Not on file   Number of children: Not on file   Years of education: Not on file   Highest education level: Not on file  Occupational History   Not on file  Tobacco Use   Smoking status: Never   Smokeless tobacco: Never  Vaping Use   Vaping Use: Never used  Substance and Sexual Activity   Alcohol use: Not Currently   Drug use: Yes    Types: Marijuana   Sexual activity: Not on file  Other Topics Concern   Not on file  Social History Narrative   Not on file   Social Determinants of Health   Financial Resource Strain: Not on file  Food Insecurity: Not on file  Transportation Needs: Not on file  Physical Activity: Not on file  Stress: Not on file  Social Connections: Not on file    Review of Systems Per HPI  Objective:  BP 114/70   Pulse 77   Temp 98.9 F (37.2 C) (Oral)   Ht 5' 9.5" (1.765 m)   Wt 192 lb 9.6 oz (87.4 kg)   SpO2 97%   BMI 28.03 kg/m      12/11/2021    4:02 PM 01/23/2021    8:44 AM 01/17/2021   10:21 AM  BP/Weight  Systolic BP 114 107 116  Diastolic BP 70 70 69  Wt. (Lbs) 192.6  188  BMI 28.03 kg/m2  27.36 kg/m2    Physical Exam Vitals and nursing note reviewed.   Constitutional:      General: He is not in acute distress.    Appearance: Normal appearance.  HENT:     Head: Normocephalic and atraumatic.     Right Ear: Tympanic membrane normal.     Left Ear: Tympanic membrane normal.     Nose: Congestion present.     Mouth/Throat:     Pharynx: Posterior oropharyngeal erythema present. No oropharyngeal exudate.  Eyes:     General:        Right eye: No discharge.        Left eye: No discharge.     Conjunctiva/sclera: Conjunctivae normal.  Cardiovascular:     Rate and Rhythm: Normal rate and regular rhythm.  Pulmonary:     Effort: Pulmonary effort is normal.     Breath sounds: Normal breath sounds. No wheezing or rales.  Neurological:     Mental Status: He is alert.    Lab Results  Component Value Date   WBC 5.7 01/27/2019   HGB 15.9 01/27/2019   HCT 45.9 01/27/2019   PLT 247 01/27/2019  GLUCOSE 107 (H) 01/27/2019   ALT 23 01/27/2019   AST 23 01/27/2019   NA 136 01/27/2019   K 3.3 (L) 01/27/2019   CL 103 01/27/2019   CREATININE 1.30 (H) 01/27/2019   BUN 14 01/27/2019   CO2 22 01/27/2019     Assessment & Plan:   Problem List Items Addressed This Visit       Respiratory   Acute rhinosinusitis - Primary    Treating empirically with Augmentin.  Promethazine DM for cough.  COVID, flu, RSV testing.      Relevant Medications   amoxicillin-clavulanate (AUGMENTIN) 875-125 MG tablet   promethazine-dextromethorphan (PROMETHAZINE-DM) 6.25-15 MG/5ML syrup   Other Relevant Orders   COVID-19, Flu A+B and RSV    Meds ordered this encounter  Medications   amoxicillin-clavulanate (AUGMENTIN) 875-125 MG tablet    Sig: Take 1 tablet by mouth 2 (two) times daily.    Dispense:  20 tablet    Refill:  0   promethazine-dextromethorphan (PROMETHAZINE-DM) 6.25-15 MG/5ML syrup    Sig: Take 5 mLs by mouth 4 (four) times daily as needed for cough.    Dispense:  118 mL    Refill:  0    Follow-up:  Return if symptoms worsen or fail to  improve.  Everlene Other DO [facility]

## 2021-12-11 NOTE — Assessment & Plan Note (Signed)
Treating empirically with Augmentin.  Promethazine DM for cough.  COVID, flu, RSV testing.

## 2021-12-11 NOTE — Patient Instructions (Signed)
Medication as prescribed while awaiting test results.  Take care  Dr. Adriana Simas

## 2021-12-13 LAB — COVID-19, FLU A+B AND RSV
Influenza A, NAA: NOT DETECTED
Influenza B, NAA: NOT DETECTED
RSV, NAA: DETECTED — AB
SARS-CoV-2, NAA: NOT DETECTED

## 2022-03-27 ENCOUNTER — Encounter: Payer: Self-pay | Admitting: Emergency Medicine

## 2022-03-27 ENCOUNTER — Ambulatory Visit
Admission: EM | Admit: 2022-03-27 | Discharge: 2022-03-27 | Disposition: A | Discharge status: Home / Self Care | Payer: 59 | Attending: Family Medicine | Admitting: Family Medicine

## 2022-03-27 ENCOUNTER — Other Ambulatory Visit: Payer: Self-pay

## 2022-03-27 DIAGNOSIS — J069 Acute upper respiratory infection, unspecified: Secondary | ICD-10-CM | POA: Diagnosis not present

## 2022-03-27 DIAGNOSIS — R519 Headache, unspecified: Secondary | ICD-10-CM

## 2022-03-27 DIAGNOSIS — R059 Cough, unspecified: Secondary | ICD-10-CM | POA: Insufficient documentation

## 2022-03-27 DIAGNOSIS — Z1152 Encounter for screening for COVID-19: Secondary | ICD-10-CM | POA: Insufficient documentation

## 2022-03-27 MED ORDER — KETOROLAC TROMETHAMINE 30 MG/ML IJ SOLN
30.0000 mg | Freq: Once | INTRAMUSCULAR | Status: AC
  Administered 2022-03-27: 30 mg via INTRAMUSCULAR

## 2022-03-27 NOTE — ED Triage Notes (Signed)
Pt reports cough, sneezing, headache since yesterday. Pt reports cough and sneezing have improved with otc medication but reports headache and "eye strain" remains.

## 2022-03-27 NOTE — ED Provider Notes (Signed)
[facility]    CSN: [id number] Arrival date & time: 03/27/22  0900      History   Chief Complaint Chief Complaint  Patient presents with   Cough    HPI Darrell Gonzales is a 22 y.o. male.   Patient presenting today with 3-day history of cough, sneezing, nasal congestion, headache, eye pressure.  Denies fever, chills, chest pain, shortness of breath, abdominal pain, nausea vomiting or diarrhea.  Taking DayQuil, NyQuil, Sudafed with good relief of the congestion and sneezing but states the headache and eye pressure remains.  No known sick contacts or pertinent chronic medical problems per patient.    Past Medical History:  Diagnosis Date   Anxiety    Panic attack     Patient Active Problem List   Diagnosis Date Noted   Acute rhinosinusitis 12/11/2021   Migraine headache without aura 09/27/2013    History reviewed. No pertinent surgical history.     Home Medications    Prior to Admission medications   Medication Sig Start Date End Date Taking? Authorizing Provider  amoxicillin-clavulanate (AUGMENTIN) 875-125 MG tablet Take 1 tablet by mouth 2 (two) times daily. 12/11/21   Coral Spikes, DO  fluticasone (FLONASE) 50 MCG/ACT nasal spray Place 2 sprays into both nostrils daily. 11/06/20   Melynda Ripple, MD  hydrocortisone (ANUSOL-HC) 2.5 % rectal cream Place 1 application rectally 2 (two) times daily as needed for hemorrhoids or anal itching. 12/05/20   Volney American, PA-C  ibuprofen (ADVIL) 600 MG tablet Take 1 tablet (600 mg total) by mouth every 6 (six) hours as needed. 11/06/20   Melynda Ripple, MD  promethazine-dextromethorphan (PROMETHAZINE-DM) 6.25-15 MG/5ML syrup Take 5 mLs by mouth 4 (four) times daily as needed for cough. 12/11/21   Coral Spikes, DO    Family History History reviewed. No pertinent family history.  Social History Social History   Tobacco Use   Smoking status: Never   Smokeless tobacco: Never  Vaping Use   Vaping  Use: Never used  Substance Use Topics   Alcohol use: Not Currently   Drug use: Yes    Types: Marijuana     Allergies   Patient has no known allergies.   Review of Systems Review of Systems Per HPI  Physical Exam Triage Vital Signs ED Triage Vitals  Enc Vitals Group     BP 03/27/22 0919 128/77     Pulse Rate 03/27/22 0919 65     Resp 03/27/22 0919 20     Temp 03/27/22 0919 98.8 F (37.1 C)     Temp Source 03/27/22 0919 Oral     SpO2 03/27/22 0919 96 %     Weight --      Height --      Head Circumference --      Peak Flow --      Pain Score 03/27/22 0918 7     Pain Loc --      Pain Edu? --      Excl. in [place]? --    No data found.  Updated Vital Signs BP 128/77 (BP Location: Right Arm)   Pulse 65   Temp 98.8 F (37.1 C) (Oral)   Resp 20   SpO2 96%   Visual Acuity Right Eye Distance:   Left Eye Distance:   Bilateral Distance:    Right Eye Near:   Left Eye Near:    Bilateral Near:     Physical Exam Vitals and nursing note reviewed.  Constitutional:      Appearance: He is well-developed.  HENT:     Head: Atraumatic.     Right Ear: Tympanic membrane and external ear normal.     Left Ear: Tympanic membrane and external ear normal.     Nose: Rhinorrhea present.     Mouth/Throat:     Mouth: Mucous membranes are moist.     Pharynx: Oropharynx is clear. Posterior oropharyngeal erythema present. No oropharyngeal exudate.  Eyes:     Conjunctiva/sclera: Conjunctivae normal.     Pupils: Pupils are equal, round, and reactive to light.  Cardiovascular:     Rate and Rhythm: Normal rate and regular rhythm.     Heart sounds: Normal heart sounds.  Pulmonary:     Effort: Pulmonary effort is normal. No respiratory distress.     Breath sounds: Wheezing present. No rales.  Musculoskeletal:        General: Normal range of motion.     Cervical back: Normal range of motion and neck supple.  Lymphadenopathy:     Cervical: No cervical adenopathy.  Skin:    General:  Skin is warm and dry.  Neurological:     General: No focal deficit present.     Mental Status: He is alert and oriented to person, place, and time.     Cranial Nerves: No cranial nerve deficit.     Motor: No weakness.     Gait: Gait normal.  Psychiatric:        Behavior: Behavior normal.      UC Treatments / Results  Labs (all labs ordered are listed, but only abnormal results are displayed) Labs Reviewed  SARS CORONAVIRUS 2 (TAT 6-24 HRS)    EKG   Radiology No results found.  Procedures Procedures (including critical care time)  Medications Ordered in UC Medications  ketorolac (TORADOL) 30 MG/ML injection 30 mg (has no administration in time range)    Initial Impression / Assessment and Plan / UC Course  I have reviewed the triage vital signs and the nursing notes.  Pertinent labs & imaging results that were available during my care of the patient were reviewed by me and considered in my medical decision making (see chart for details).     Vitals and exam reassuring with no neurologic deficits or red flag findings.  Suspect viral etiology, COVID testing pending, treat with IM Toradol for the significant headache, continued cold and congestion medications and supportive home care.  Work note given.  Return for worsening symptoms.  Final Clinical Impressions(s) / UC Diagnoses   Final diagnoses:  Viral URI with cough  Acute nonintractable headache, unspecified headache type     Discharge Instructions      Do not take any ibuprofen or aleve for the next 48 hours as it is similar to the medication we have given a shot of. You may take tylenol during this time as needed    ED Prescriptions   None    PDMP not reviewed this encounter.   Volney American, [place] 03/27/22 1006

## 2022-03-27 NOTE — Discharge Instructions (Signed)
Do not take any ibuprofen or aleve for the next 48 hours as it is similar to the medication we have given a shot of. You may take tylenol during this time as needed

## 2022-03-28 LAB — SARS CORONAVIRUS 2 (TAT 6-24 HRS): SARS Coronavirus 2: NEGATIVE

## 2022-07-16 ENCOUNTER — Ambulatory Visit
Admission: EM | Admit: 2022-07-16 | Discharge: 2022-07-16 | Disposition: A | Discharge status: Home / Self Care | Payer: 59 | Attending: Nurse Practitioner | Admitting: Nurse Practitioner

## 2022-07-16 DIAGNOSIS — N4889 Other specified disorders of penis: Secondary | ICD-10-CM | POA: Diagnosis not present

## 2022-07-16 NOTE — ED Triage Notes (Signed)
Pt states he noticed small warts/ possible skin tag on penis that started 6 months ago. Pt has been trying to treat at home with witch hazel and tea tree oil but stays the same.

## 2022-07-16 NOTE — ED Provider Notes (Signed)
[facility]    CSN: [id number] Arrival date & time: 07/16/22  1549      History   Chief Complaint Chief Complaint  Patient presents with   SEXUALLY TRANSMITTED DISEASE    HPI Darrell Gonzales is a 22 y.o. male.   Patient presents today with significant other with concern for warts on his penis.  Significant other reports she had recently been diagnosed with genital warts.  Patient reports there has been an area on his penis has been present for 6 months or more, it is not painful and not oozing anything.  No penile discharge, swelling in the groin, abdominal pain, nausea/vomiting, other sores, rashes, or lesions on the genitalia.  No fever.  Has been applying tea tree oil and witch hazel without improvement.  He also used significant others cream for genital warts without any benefit.    Past Medical History:  Diagnosis Date   Anxiety    Panic attack     Patient Active Problem List   Diagnosis Date Noted   Acute rhinosinusitis 12/11/2021   Migraine headache without aura 09/27/2013    History reviewed. No pertinent surgical history.     Home Medications    Prior to Admission medications   Medication Sig Start Date End Date Taking? Authorizing Provider  fluticasone (FLONASE) 50 MCG/ACT nasal spray Place 2 sprays into both nostrils daily. 11/06/20  Yes Domenick Gong, MD  ibuprofen (ADVIL) 600 MG tablet Take 1 tablet (600 mg total) by mouth every 6 (six) hours as needed. 11/06/20  Yes Domenick Gong, MD  naproxen (NAPROSYN) 500 MG tablet Take 500 mg by mouth 2 (two) times daily with a meal.   Yes [provider]    Family History History reviewed. No pertinent family history.  Social History Social History   Tobacco Use   Smoking status: Never   Smokeless tobacco: Never  Vaping Use   Vaping Use: Never used  Substance Use Topics   Alcohol use: Not Currently   Drug use: Yes    Types: Marijuana     Allergies   Patient has no known  allergies.   Review of Systems Review of Systems Per HPI  Physical Exam Triage Vital Signs ED Triage Vitals  Enc Vitals Group     BP 07/16/22 1601 119/70     Pulse Rate 07/16/22 1601 61     Resp 07/16/22 1601 16     Temp 07/16/22 1601 98.2 F (36.8 C)     Temp Source 07/16/22 1601 Oral     SpO2 07/16/22 1601 97 %     Weight --      Height --      Head Circumference --      Peak Flow --      Pain Score 07/16/22 1602 0     Pain Loc --      Pain Edu? --      Excl. in [place]? --    No data found.  Updated Vital Signs BP 119/70 (BP Location: Right Arm)   Pulse 61   Temp 98.2 F (36.8 C) (Oral)   Resp 16   SpO2 97%   Visual Acuity Right Eye Distance:   Left Eye Distance:   Bilateral Distance:    Right Eye Near:   Left Eye Near:    Bilateral Near:     Physical Exam Vitals and nursing note reviewed. Exam conducted with a chaperone present Neville Route, CMA).  Constitutional:  General: He is not in acute distress.    Appearance: Normal appearance. He is not toxic-appearing.  Pulmonary:     Effort: Pulmonary effort is normal. No respiratory distress.  Genitourinary:    Pubic Area: No rash.      Penis: Normal and circumcised.      Testes: Normal.     Comments: Pearly penile papules noted to volar penis immediately inferior to penile head.  Nontender to touch, no active drainage. Lymphadenopathy:     Lower Body: No right inguinal adenopathy. No left inguinal adenopathy.  Skin:    General: Skin is warm and dry.     Capillary Refill: Capillary refill takes less than 2 seconds.     Coloration: Skin is not jaundiced or pale.  Neurological:     Mental Status: He is alert and oriented to person, place, and time.     Motor: No weakness.     Gait: Gait normal.  Psychiatric:        Behavior: Behavior is cooperative.      UC Treatments / Results  Labs (all labs ordered are listed, but only abnormal results are displayed) Labs Reviewed  CYTOLOGY, (ORAL, ANAL,  URETHRAL) ANCILLARY ONLY    EKG   Radiology No results found.  Procedures Procedures (including critical care time)  Medications Ordered in UC Medications - No data to display  Initial Impression / Assessment and Plan / UC Course  I have reviewed the triage vital signs and the nursing notes.  Pertinent labs & imaging results that were available during my care of the patient were reviewed by me and considered in my medical decision making (see chart for details).   Patient is well-appearing, normotensive, afebrile, not tachycardic, not tachypneic, oxygenating well on room air.    1. Pearly penile papules Reassurance provided, low suspicion for penile warts today Cytology swab is pending as patient has recently had unprotected sexual intercourse, however he is not having any symptoms today Recommended condom use with every sexual counter moving forward  The patient was given the opportunity to ask questions.  All questions answered to their satisfaction.  The patient is in agreement to this plan.    Final Clinical Impressions(s) / UC Diagnoses   Final diagnoses:  Pearly penile papules     Discharge Instructions      The small bumps on your penis do not appear to be anything harmful.  I do not think it is genital warts or any other type of STD.    We will call you if the penile swab from today comes back positive for anything, however if it does it is unrelated to the small bumps on your penis.  Recommend condom use with every sexual encounter to prevent STI    ED Prescriptions   None    PDMP not reviewed this encounter.   Valentino Nose, NP 07/16/22 [phone]

## 2022-07-16 NOTE — Discharge Instructions (Addendum)
The small bumps on your penis do not appear to be anything harmful.  I do not think it is genital warts or any other type of STD.    We will call you if the penile swab from today comes back positive for anything, however if it does it is unrelated to the small bumps on your penis.  Recommend condom use with every sexual encounter to prevent STI

## 2022-07-17 LAB — CYTOLOGY, (ORAL, ANAL, URETHRAL) ANCILLARY ONLY
Chlamydia: NEGATIVE
Comment: NEGATIVE
Comment: NEGATIVE
Comment: NORMAL
Neisseria Gonorrhea: NEGATIVE
Trichomonas: NEGATIVE

## 2022-08-21 ENCOUNTER — Ambulatory Visit
Admission: EM | Admit: 2022-08-21 | Discharge: 2022-08-21 | Disposition: A | Discharge status: Home / Self Care | Payer: 59 | Attending: Nurse Practitioner | Admitting: Nurse Practitioner

## 2022-08-21 ENCOUNTER — Encounter: Payer: Self-pay | Admitting: Emergency Medicine

## 2022-08-21 DIAGNOSIS — B349 Viral infection, unspecified: Secondary | ICD-10-CM | POA: Diagnosis present

## 2022-08-21 DIAGNOSIS — Z1152 Encounter for screening for COVID-19: Secondary | ICD-10-CM | POA: Insufficient documentation

## 2022-08-21 LAB — POCT RAPID STREP A (OFFICE): Rapid Strep A Screen: NEGATIVE

## 2022-08-21 NOTE — ED Triage Notes (Signed)
Sore throat started on Friday with headache.  States he feels fatigued.  States it feels like something is stuck in his throat.

## 2022-08-21 NOTE — Discharge Instructions (Signed)
The rapid strep test was negative.  A throat culture and COVID test are pending.  As discussed, you will be contacted if the pending test results are abnormal.  You have declined antiviral treatment for COVID if your test is positive. May take over-the-counter Tylenol or ibuprofen as needed for pain, fever, general discomfort. Increase fluids and allow for plenty of rest.  Make sure you are eating at least 3 meals a day. Warm salt water gargles as needed for throat pain or discomfort. If symptoms are not improving over the next 7 to 10 days, and your test are negative, please follow-up with your primary care physician for further evaluation. Follow-up as needed.

## 2022-08-21 NOTE — ED Provider Notes (Signed)
[facility]    CSN: [id number] Arrival date & time: 08/21/22  1448      History   Chief Complaint No chief complaint on file.   HPI Darrell Gonzales is a 22 y.o. male.   The history is provided by the patient.   Patient presents for complaints of sore throat, headache, and fatigue.  Symptoms started approximately 3 days ago.  Patient states headache and sore throat have since improved; however, he continues to feel fatigued.  Patient reports that he was around his aunt who was recently diagnosed with COVID.  Patient denies fever, chills, ear pain, ear drainage, cough, chest pain, abdominal pain, nausea, vomiting, or diarrhea.  Patient reports he has not taken any medication for his symptoms.  Past Medical History:  Diagnosis Date   Anxiety    Panic attack     Patient Active Problem List   Diagnosis Date Noted   Acute rhinosinusitis 12/11/2021   Migraine headache without aura 09/27/2013    History reviewed. No pertinent surgical history.     Home Medications    Prior to Admission medications   Medication Sig Start Date End Date Taking? Authorizing Provider  fluticasone (FLONASE) 50 MCG/ACT nasal spray Place 2 sprays into both nostrils daily. 11/06/20   Domenick Gong, MD  ibuprofen (ADVIL) 600 MG tablet Take 1 tablet (600 mg total) by mouth every 6 (six) hours as needed. 11/06/20   Domenick Gong, MD  naproxen (NAPROSYN) 500 MG tablet Take 500 mg by mouth 2 (two) times daily with a meal.    [provider]    Family History History reviewed. No pertinent family history.  Social History Social History   Tobacco Use   Smoking status: Never   Smokeless tobacco: Never  Vaping Use   Vaping status: Never Used  Substance Use Topics   Alcohol use: Not Currently   Drug use: Yes    Types: Marijuana     Allergies   Patient has no known allergies.   Review of Systems Review of Systems Per HPI  Physical Exam Triage Vital Signs ED  Triage Vitals  Encounter Vitals Group     BP 08/21/22 1527 117/70     Systolic BP Percentile --      Diastolic BP Percentile --      Pulse Rate 08/21/22 1527 69     Resp 08/21/22 1527 18     Temp 08/21/22 1527 99.1 F (37.3 C)     Temp Source 08/21/22 1527 Oral     SpO2 08/21/22 1527 98 %     Weight --      Height --      Head Circumference --      Peak Flow --      Pain Score 08/21/22 1528 0     Pain Loc --      Pain Education --      Exclude from Growth Chart --    No data found.  Updated Vital Signs BP 117/70 (BP Location: Right Arm)   Pulse 69   Temp 99.1 F (37.3 C) (Oral)   Resp 18   SpO2 98%   Visual Acuity Right Eye Distance:   Left Eye Distance:   Bilateral Distance:    Right Eye Near:   Left Eye Near:    Bilateral Near:     Physical Exam Vitals and nursing note reviewed.  Constitutional:      General: He is not in acute distress.  Appearance: Normal appearance.  HENT:     Head: Normocephalic.     Right Ear: Tympanic membrane, ear canal and external ear normal.     Left Ear: Tympanic membrane, ear canal and external ear normal.     Nose: Nose normal.     Mouth/Throat:     Lips: Pink.     Mouth: Mucous membranes are moist.     Pharynx: Oropharynx is clear. Uvula midline. Posterior oropharyngeal erythema and postnasal drip present. No pharyngeal swelling, oropharyngeal exudate or uvula swelling.  Eyes:     Extraocular Movements: Extraocular movements intact.     Conjunctiva/sclera: Conjunctivae normal.     Pupils: Pupils are equal, round, and reactive to light.  Cardiovascular:     Rate and Rhythm: Normal rate and regular rhythm.     Pulses: Normal pulses.     Heart sounds: Normal heart sounds.  Pulmonary:     Effort: Pulmonary effort is normal. No respiratory distress.     Breath sounds: Normal breath sounds. No stridor. No wheezing, rhonchi or rales.  Abdominal:     General: Bowel sounds are normal.     Palpations: Abdomen is soft.      Tenderness: There is no abdominal tenderness.  Musculoskeletal:     Cervical back: Normal range of motion.  Lymphadenopathy:     Cervical: No cervical adenopathy.  Skin:    General: Skin is warm and dry.  Neurological:     General: No focal deficit present.     Mental Status: He is alert and oriented to person, place, and time.  Psychiatric:        Mood and Affect: Mood normal.        Behavior: Behavior normal.      UC Treatments / Results  Labs (all labs ordered are listed, but only abnormal results are displayed) Labs Reviewed  POCT RAPID STREP A (OFFICE)    EKG   Radiology No results found.  Procedures Procedures (including critical care time)  Medications Ordered in UC Medications - No data to display  Initial Impression / Assessment and Plan / UC Course  I have reviewed the triage vital signs and the nursing notes.  Pertinent labs & imaging results that were available during my care of the patient were reviewed by me and considered in my medical decision making (see chart for details).  The patient is well-appearing, he is in no acute distress, vital signs are stable.  Rapid strep test is negative.  Throat culture and COVID test are pending.  Symptoms have improved since onset, symptoms likely of viral etiology.  Patient declines antiviral therapy if his COVID test is positive.  Supportive care recommendations were provided and discussed with the patient to include increasing fluids, allowing for plenty of rest, eating 3 meals a day, and warm salt water gargles as needed.  Patient is in agreement with this plan of care and verbalizes understanding.  All questions were answered.  Patient stable for discharge.  Final Clinical Impressions(s) / UC Diagnoses   Final diagnoses:  None   Discharge Instructions   None    ED Prescriptions   None    PDMP not reviewed this encounter.   Abran Cantor, NP 08/21/22 1547

## 2022-08-22 ENCOUNTER — Telehealth: Payer: Self-pay | Admitting: Emergency Medicine

## 2022-08-22 MED ORDER — PROMETHAZINE-DM 6.25-15 MG/5ML PO SYRP: 5.0000 mL | ORAL_SOLUTION | Freq: Four times a day (QID) | ORAL | 0 refills | Status: DC | PRN

## 2022-08-22 NOTE — Telephone Encounter (Signed)
Patient called and is requesting cough medication.  Patient covid positive.  Promethazine-DM sent into [facility]

## 2022-08-23 ENCOUNTER — Encounter: Payer: 59 | Admitting: Family Medicine

## 2022-09-20 ENCOUNTER — Ambulatory Visit (INDEPENDENT_AMBULATORY_CARE_PROVIDER_SITE_OTHER): Payer: 59 | Admitting: Nurse Practitioner

## 2022-09-20 ENCOUNTER — Encounter: Payer: Self-pay | Admitting: Nurse Practitioner

## 2022-09-20 VITALS — BP 118/68 | HR 64 | Temp 97.2°F | Ht 69.5 in | Wt 188.0 lb

## 2022-09-20 DIAGNOSIS — Z23 Encounter for immunization: Secondary | ICD-10-CM | POA: Diagnosis not present

## 2022-09-20 DIAGNOSIS — R5383 Other fatigue: Secondary | ICD-10-CM

## 2022-09-20 DIAGNOSIS — Z1159 Encounter for screening for other viral diseases: Secondary | ICD-10-CM

## 2022-09-20 DIAGNOSIS — Z114 Encounter for screening for human immunodeficiency virus [HIV]: Secondary | ICD-10-CM

## 2022-09-20 DIAGNOSIS — Z0001 Encounter for general adult medical examination with abnormal findings: Secondary | ICD-10-CM

## 2022-09-20 DIAGNOSIS — Z0000 Encounter for general adult medical examination without abnormal findings: Secondary | ICD-10-CM

## 2022-09-20 NOTE — Progress Notes (Signed)
Subjective:    Patient ID: Darrell Gonzales, male    DOB: August 15, 2000, 22 y.o.   MRN: [id number]  HPI The patient comes in today for a wellness visit.    A review of their health history was completed.  A review of medications was also completed.  Any needed refills; none  Eating habits: Good.  Falls/  MVA accidents in past few months: no  Regular exercise: Plays basketball regularly.   Specialist pt sees on regular basis: no  Preventative health issues were discussed.   Additional concerns: none   Sexual History: Currently sexually active. Same sexual partner. Had negative STI screening 2 months prior to this visit.   Social History   Tobacco Use   Smoking status: Never   Smokeless tobacco: Never  Vaping Use   Vaping status: Never Used  Substance Use Topics   Alcohol use: Not Currently   Drug use: Yes    Types: Marijuana      Review of Systems  Constitutional:  Negative for activity change, appetite change and fatigue.  HENT:  Negative for dental problem, ear pain, sinus pressure and sore throat.   Respiratory:  Negative for cough, chest tightness, shortness of breath and wheezing.   Cardiovascular:  Negative for chest pain.  Gastrointestinal:  Negative for abdominal distention, abdominal pain, constipation, diarrhea, nausea and vomiting.       Reports decreased appetite  Genitourinary:  Negative for difficulty urinating, dysuria, enuresis, frequency, genital sores, penile discharge, penile pain, penile swelling, scrotal swelling, testicular pain and urgency.  Psychiatric/Behavioral:  Positive for sleep disturbance. Negative for self-injury. The patient is nervous/anxious.        Brief suicidal thoughts, denies intention or plan.       09/20/2022    3:06 PM 12/15/2019   11:02 AM 01/19/2019    1:04 PM  Depression screen PHQ 2/9  Decreased Interest 2 2 3   Down, Depressed, Hopeless 2 3 3   PHQ - 2 Score 4 5 6   Altered sleeping 2 2 0  Tired, decreased energy 2  2 1   Change in appetite 2 1 0  Feeling bad or failure about yourself  2 1 0  Trouble concentrating 0 0 0  Moving slowly or fidgety/restless 2 1 1   Suicidal thoughts 1 2 1   PHQ-9 Score 15 14 9   Difficult doing work/chores Extremely dIfficult Somewhat difficult Somewhat difficult         09/20/2022    3:08 PM 12/15/2019   11:02 AM 01/19/2019    1:02 PM  GAD 7 : Generalized Anxiety Score  Nervous, Anxious, on Edge 1 2 3   Control/stop worrying 2 3 3   Worry too much - different things 2 3 3   Trouble relaxing 2 2 1   Restless 1 0 0  Easily annoyed or irritable 2 3 1   Afraid - awful might happen 2 3 1   Total GAD 7 Score 12 16 12   Anxiety Difficulty Extremely difficult Somewhat difficult Somewhat difficult           Objective:   Physical Exam Vitals and nursing note reviewed.  Constitutional:      General: He is not in acute distress.    Appearance: Normal appearance.  HENT:     Right Ear: Tympanic membrane normal.     Left Ear: Tympanic membrane normal.     Mouth/Throat:     Mouth: Mucous membranes are moist.     Pharynx: Oropharynx is clear.  Eyes:  Conjunctiva/sclera: Conjunctivae normal.     Pupils: Pupils are equal, round, and reactive to light.  Neck:     Comments: Thyroid non-tender to palpation, no nodules or goiter noted.  Cardiovascular:     Rate and Rhythm: Normal rate and regular rhythm.     Heart sounds: No murmur heard. Pulmonary:     Effort: Pulmonary effort is normal.     Breath sounds: Normal breath sounds.  Abdominal:     General: Abdomen is flat. There is no distension.     Palpations: Abdomen is soft. There is no mass.     Tenderness: There is no abdominal tenderness.  Genitourinary:    Comments: Deferred. Denies any problems.  Musculoskeletal:     Cervical back: Neck supple.  Lymphadenopathy:     Cervical: No cervical adenopathy.  Skin:    General: Skin is warm and dry.  Neurological:     Mental Status: He is alert and oriented to  person, place, and time.  Psychiatric:     Comments: Avoided eye contact, mostly flat affect. Speech clear. Dressed appropriately for the weather. Thoughts logical, coherent and relevant.     Today's Vitals   09/20/22 0950  BP: 118/68  Pulse: 64  Temp: (!) 97.2 F (36.2 C)  SpO2: 97%  Weight: 85.3 kg  Height: 5' 9.5" (1.765 m)   Body mass index is 27.36 kg/m.      Assessment & Plan:   1. Immunization due Tdap immunization received during today's visit.  - Tdap vaccine greater than or equal to 7yo IM  2. Well adult exam Discussed counseling or medication to help with his feelings of depression and anxiety . Patient deferred at this time. Instructed patient to go to the nearest ED with any mood changes or suicidal ideations/plans. Educated on good sleep hygiene practices, safe sex practices, and routine screenings.  - Tdap vaccine greater than or equal to 7yo IM - HIV antibody (with reflex) - Hepatitis C Antibody - CBC with Differential - Comprehensive metabolic panel - Lipid Panel - TSH  3. Encounter for screening for HIV  - HIV antibody (with reflex)  4. Encounter for hepatitis C screening test for low risk patient  - Hepatitis C Antibody  5. Other fatigue  - TSH  Return in about 1 year (around 09/20/2023) for physical. Call back sooner if needed.

## 2022-09-20 NOTE — Progress Notes (Signed)
   Subjective:    Patient ID: Darrell Gonzales, male    DOB: September 28, 2000, 22 y.o.   MRN: [id number]  HPI    Review of Systems     Objective:   Physical Exam        Assessment & Plan:

## 2022-09-21 LAB — COMPREHENSIVE METABOLIC PANEL
ALT: 15 IU/L (ref 0–44)
AST: 16 IU/L (ref 0–40)
Albumin: 5.1 g/dL (ref 4.3–5.2)
Alkaline Phosphatase: 66 IU/L (ref 44–121)
BUN/Creatinine Ratio: 14 (ref 9–20)
BUN: 18 mg/dL (ref 6–20)
Bilirubin Total: 0.8 mg/dL (ref 0.0–1.2)
CO2: 23 mmol/L (ref 20–29)
Calcium: 9.7 mg/dL (ref 8.7–10.2)
Chloride: 102 mmol/L (ref 96–106)
Creatinine, Ser: 1.33 mg/dL — ABNORMAL HIGH (ref 0.76–1.27)
Globulin, Total: 2.2 g/dL (ref 1.5–4.5)
Glucose: 101 mg/dL — ABNORMAL HIGH (ref 70–99)
Potassium: 4.5 mmol/L (ref 3.5–5.2)
Sodium: 140 mmol/L (ref 134–144)
Total Protein: 7.3 g/dL (ref 6.0–8.5)
eGFR: 77 mL/min/{1.73_m2} (ref >59)

## 2022-09-21 LAB — CBC WITH DIFFERENTIAL/PLATELET
Basophils Absolute: 0 10*3/uL (ref 0.0–0.2)
Basos: 0 %
EOS (ABSOLUTE): 0.1 10*3/uL (ref 0.0–0.4)
Eos: 2 %
Hematocrit: 47.3 % (ref 37.5–51.0)
Hemoglobin: 15.7 g/dL (ref 13.0–17.7)
Immature Grans (Abs): 0 10*3/uL (ref 0.0–0.1)
Immature Granulocytes: 0 %
Lymphocytes Absolute: 1.3 10*3/uL (ref 0.7–3.1)
Lymphs: 35 %
MCH: 30.6 pg (ref 26.6–33.0)
MCHC: 33.2 g/dL (ref 31.5–35.7)
MCV: 92 fL (ref 79–97)
Monocytes Absolute: 0.3 10*3/uL (ref 0.1–0.9)
Monocytes: 7 %
Neutrophils Absolute: 2.1 10*3/uL (ref 1.4–7.0)
Neutrophils: 56 %
Platelets: 233 10*3/uL (ref 150–450)
RBC: 5.13 x10E6/uL (ref 4.14–5.80)
RDW: 12 % (ref 11.6–15.4)
WBC: 3.8 10*3/uL (ref 3.4–10.8)

## 2022-09-21 LAB — HEPATITIS C ANTIBODY: Hep C Virus Ab: NONREACTIVE

## 2022-09-21 LAB — LIPID PANEL
Chol/HDL Ratio: 2.4 ratio (ref 0.0–5.0)
Cholesterol, Total: 144 mg/dL (ref 100–199)
HDL: 59 mg/dL (ref >39)
LDL Chol Calc (NIH): 74 mg/dL (ref 0–99)
Triglycerides: 51 mg/dL (ref 0–149)
VLDL Cholesterol Cal: 11 mg/dL (ref 5–40)

## 2022-09-21 LAB — TSH: TSH: 1.14 u[IU]/mL (ref 0.450–4.500)

## 2022-09-21 LAB — HIV ANTIBODY (ROUTINE TESTING W REFLEX): HIV Screen 4th Generation wRfx: NONREACTIVE

## 2023-02-04 ENCOUNTER — Encounter: Payer: Self-pay | Admitting: Family Medicine

## 2023-02-04 ENCOUNTER — Telehealth: Payer: Self-pay | Admitting: *Deleted

## 2023-02-04 NOTE — Telephone Encounter (Signed)
 It is okay to give him a work note for those 2 days

## 2023-02-04 NOTE — Telephone Encounter (Signed)
 Copied from CRM [id number]. Topic: Clinical - Medical Advice >> Feb 04, 2023 11:40 AM Alfonso ORN wrote: Reason for CRM: Patient was out sick 2 days with a cold and now feeling better, his employer want patient to get a doctor's note before returning to work , please reach out to patient to let know if can get a note  Callback # [phone]

## 2023-03-18 ENCOUNTER — Ambulatory Visit
Admission: RE | Admit: 2023-03-18 | Discharge: 2023-03-18 | Disposition: A | Discharge status: Home / Self Care | Payer: BC Managed Care – PPO | Source: Ambulatory Visit | Attending: Family Medicine | Admitting: Family Medicine

## 2023-03-18 VITALS — BP 118/72 | HR 54 | Temp 98.2°F | Resp 18

## 2023-03-18 DIAGNOSIS — J069 Acute upper respiratory infection, unspecified: Secondary | ICD-10-CM

## 2023-03-18 NOTE — ED Triage Notes (Signed)
 Pt reports cough congestion fatigue since Thursday of last week.

## 2023-03-18 NOTE — ED Provider Notes (Signed)
 [facility]    CSN: [id number] Arrival date & time: 03/18/23  1602      History   Chief Complaint Chief Complaint  Patient presents with   Cough    for 4 plus days I have felt weak, tired, coughing, decongested. - Entered by patient    HPI Darrell Gonzales is a 23 y.o. male.   Patient presents today with 4-day history of chills, runny and stuffy nose, sore throat, headache, decreased appetite, and fatigue.  Reports most of his symptoms are better except for the runny/stuffy nose.  He denies fever or body aches, cough, shortness of breath or chest pain, ear pain, abdominal pain, nausea/vomiting, and diarrhea.  Has taken over-the-counter cold and flu liquid medication that did help significantly with symptoms.  No known sick contacts.    Past Medical History:  Diagnosis Date   Anxiety    Panic attack     Patient Active Problem List   Diagnosis Date Noted   Acute rhinosinusitis 12/11/2021   Migraine headache without aura 09/27/2013    History reviewed. No pertinent surgical history.     Home Medications    Prior to Admission medications   Medication Sig Start Date End Date Taking? Authorizing Provider  fluticasone (FLONASE) 50 MCG/ACT nasal spray Place 2 sprays into both nostrils daily. 11/06/20   Domenick Gong, MD  ibuprofen (ADVIL) 600 MG tablet Take 1 tablet (600 mg total) by mouth every 6 (six) hours as needed. 11/06/20   Domenick Gong, MD  naproxen (NAPROSYN) 500 MG tablet Take 500 mg by mouth 2 (two) times daily with a meal.    [provider]    Family History History reviewed. No pertinent family history.  Social History Social History   Tobacco Use   Smoking status: Never   Smokeless tobacco: Never  Vaping Use   Vaping status: Never Used  Substance Use Topics   Alcohol use: Not Currently   Drug use: Yes    Types: Marijuana     Allergies   Patient has no known allergies.   Review of Systems Review of Systems Per  HPI  Physical Exam Triage Vital Signs ED Triage Vitals  Encounter Vitals Group     BP 03/18/23 1651 118/72     Systolic BP Percentile --      Diastolic BP Percentile --      Pulse Rate 03/18/23 1651 (!) 54     Resp 03/18/23 1651 18     Temp 03/18/23 1651 98.2 F (36.8 C)     Temp Source 03/18/23 1651 Oral     SpO2 03/18/23 1651 96 %     Weight --      Height --      Head Circumference --      Peak Flow --      Pain Score 03/18/23 1654 0     Pain Loc --      Pain Education --      Exclude from Growth Chart --    No data found.  Updated Vital Signs BP 118/72 (BP Location: Right Arm)   Pulse (!) 54   Temp 98.2 F (36.8 C) (Oral)   Resp 18   SpO2 96%   Visual Acuity Right Eye Distance:   Left Eye Distance:   Bilateral Distance:    Right Eye Near:   Left Eye Near:    Bilateral Near:     Physical Exam Vitals and nursing note reviewed.  Constitutional:  General: He is not in acute distress.    Appearance: Normal appearance. He is not ill-appearing or toxic-appearing.  HENT:     Head: Normocephalic and atraumatic.     Right Ear: Tympanic membrane, ear canal and external ear normal.     Left Ear: Tympanic membrane, ear canal and external ear normal.     Nose: Nose normal. No congestion or rhinorrhea.     Mouth/Throat:     Mouth: Mucous membranes are moist.     Pharynx: Oropharynx is clear. No oropharyngeal exudate or posterior oropharyngeal erythema.  Eyes:     General: No scleral icterus.    Extraocular Movements: Extraocular movements intact.  Cardiovascular:     Rate and Rhythm: Normal rate and regular rhythm.  Pulmonary:     Effort: Pulmonary effort is normal. No respiratory distress.     Breath sounds: Normal breath sounds. No wheezing, rhonchi or rales.  Musculoskeletal:     Cervical back: Normal range of motion and neck supple.  Lymphadenopathy:     Cervical: No cervical adenopathy.  Skin:    General: Skin is warm and dry.     Coloration: Skin  is not jaundiced or pale.     Findings: No erythema or rash.  Neurological:     Mental Status: He is alert and oriented to person, place, and time.  Psychiatric:        Behavior: Behavior is cooperative.      UC Treatments / Results  Labs (all labs ordered are listed, but only abnormal results are displayed) Labs Reviewed - No data to display  EKG   Radiology No results found.  Procedures Procedures (including critical care time)  Medications Ordered in UC Medications - No data to display  Initial Impression / Assessment and Plan / UC Course  I have reviewed the triage vital signs and the nursing notes.  Pertinent labs & imaging results that were available during my care of the patient were reviewed by me and considered in my medical decision making (see chart for details).   Patient is well-appearing, normotensive, afebrile, not tachycardic, not tachypneic, oxygenating well on room air.    1. Viral URI with cough Suspect viral etiology Vitals and exam are reassuring today Viral testing deferred given length of symptoms Supportive care discussed with patient ER and return precautions discussed Work excuse provided  The patient was given the opportunity to ask questions.  All questions answered to their satisfaction.  The patient is in agreement to this plan.   Final Clinical Impressions(s) / UC Diagnoses   Final diagnoses:  Viral URI with cough     Discharge Instructions      You have a viral upper respiratory infection.  Symptoms should improve over the next week to 10 days.  If you develop chest pain or shortness of breath, go to the emergency room.  Some things that can make you feel better are: - Increased rest - Increasing fluid with water/sugar free electrolytes - Acetaminophen and ibuprofen as needed for fever/pain - Salt water gargling, chloraseptic spray and throat lozenges - OTC guaifenesin (Mucinex) 600 mg twice daily for congestion - Saline  sinus flushes or a neti pot - Humidifying the air     ED Prescriptions   None    PDMP not reviewed this encounter.   Valentino Nose, NP 03/18/23 [phone]

## 2023-03-18 NOTE — Discharge Instructions (Signed)
 You have a viral upper respiratory infection.  Symptoms should improve over the next week to 10 days.  If you develop chest pain or shortness of breath, go to the emergency room.  Some things that can make you feel better are: - Increased rest - Increasing fluid with water/sugar free electrolytes - Acetaminophen and ibuprofen as needed for fever/pain - Salt water gargling, chloraseptic spray and throat lozenges - OTC guaifenesin (Mucinex) 600 mg twice daily for congestion - Saline sinus flushes or a neti pot - Humidifying the air
